# Patient Record
Sex: Female | Born: 1990 | Race: Black or African American | Hispanic: No | Marital: Single | Smoking: Never smoker
Health system: Southern US, Community
[De-identification: ages and names within clinical notes are randomized; demographics above are authoritative.]

---

## 2017-10-07 ENCOUNTER — Encounter (HOSPITAL_COMMUNITY): Payer: Self-pay | Admitting: *Deleted

## 2017-10-07 ENCOUNTER — Other Ambulatory Visit: Payer: Self-pay

## 2017-10-07 ENCOUNTER — Ambulatory Visit (HOSPITAL_COMMUNITY)
Admission: EM | Admit: 2017-10-07 | Discharge: 2017-10-07 | Disposition: A | Discharge status: Home / Self Care | Payer: Self-pay | Attending: Family Medicine | Admitting: Family Medicine

## 2017-10-07 DIAGNOSIS — R110 Nausea: Secondary | ICD-10-CM

## 2017-10-07 DIAGNOSIS — M545 Low back pain: Secondary | ICD-10-CM

## 2017-10-07 DIAGNOSIS — R300 Dysuria: Secondary | ICD-10-CM

## 2017-10-07 LAB — POCT URINALYSIS DIP (DEVICE)
BILIRUBIN URINE: NEGATIVE
Glucose, UA: NEGATIVE mg/dL
Hgb urine dipstick: NEGATIVE
KETONES UR: NEGATIVE mg/dL
Nitrite: NEGATIVE
PH: 6 (ref 5.0–8.0)
PROTEIN: NEGATIVE mg/dL
SPECIFIC GRAVITY, URINE: 1.025 (ref 1.005–1.030)
Urobilinogen, UA: 1 mg/dL (ref 0.0–1.0)

## 2017-10-07 LAB — POCT PREGNANCY, URINE: PREG TEST UR: NEGATIVE

## 2017-10-07 MED ORDER — NITROFURANTOIN MONOHYD MACRO 100 MG PO CAPS: 100.0000 mg | ORAL_CAPSULE | Freq: Two times a day (BID) | ORAL | 0 refills | Status: AC

## 2017-10-07 NOTE — ED Provider Notes (Signed)
[facility]    CSN: [id number] Arrival date & time: 10/07/17  1636     History   Chief Complaint Chief Complaint  Patient presents with  . Dysuria    HPI Anne Mckenzie is a 27 y.o. female.   [name] presents with complaints of pain and frequency with urination which started approximately three days ago. States also with low back pain and mild nausea. Without vomiting or diarrhea. Denies abdominal pain, vaginal bleeding, discharge or itching. States has had similar symptoms in the past and was UTI. Without fevers. She is sexually active with 1 partner, uses condoms, is not on birth control. No known std exposure. LMP 09/21/16. Pain to low back 4/10, it comes and goes. Took ibuprofen which did not help. Has increased her water intake.    ROS per HPI.       History reviewed. No pertinent past medical history.  There are no active problems to display for this patient.   History reviewed. No pertinent surgical history.  OB History    No data available       Home Medications    Prior to Admission medications   Medication Sig Start Date End Date Taking? Authorizing Provider  nitrofurantoin, macrocrystal-monohydrate, (MACROBID) 100 MG capsule Take 1 capsule (100 mg total) by mouth 2 (two) times daily for 5 days. 10/07/17 10/12/17  Georgetta HaberBurky, Natalie B, NP    Family History History reviewed. No pertinent family history.  Social History Social History   Tobacco Use  . Smoking status: Never Smoker  Substance Use Topics  . Alcohol use: Yes    Comment: rarely  . Drug use: No     Allergies   Vancomycin   Review of Systems Review of Systems   Physical Exam Triage Vital Signs ED Triage Vitals  Enc Vitals Group     BP 10/07/17 1741 106/66     Pulse Rate 10/07/17 1741 65     Resp 10/07/17 1741 16     Temp 10/07/17 1741 98.1 F (36.7 C)     Temp Source 10/07/17 1741 Oral     SpO2 10/07/17 1741 100 %     Weight --      Height --      Head  Circumference --      Peak Flow --      Pain Score 10/07/17 1742 4     Pain Loc --      Pain Edu? --      Excl. in GC? --    No data found.  Updated Vital Signs BP 106/66   Pulse 65   Temp 98.1 F (36.7 C) (Oral)   Resp 16   LMP 09/21/2017 (Exact Date)   SpO2 100%   Visual Acuity Right Eye Distance:   Left Eye Distance:   Bilateral Distance:    Right Eye Near:   Left Eye Near:    Bilateral Near:     Physical Exam  Constitutional: She is oriented to person, place, and time. She appears well-developed and well-nourished. No distress.  Cardiovascular: Normal rate, regular rhythm and normal heart sounds.  Pulmonary/Chest: Effort normal and breath sounds normal.  Abdominal: Soft. There is no tenderness.  Genitourinary:  Genitourinary Comments: Without abdominal pain, denies vaginal symptoms; gu exam deferred at this time  Neurological: She is alert and oriented to person, place, and time.  Skin: Skin is warm and dry.     UC Treatments / Results  Labs (all labs ordered are listed,  but only abnormal results are displayed) Labs Reviewed  POCT URINALYSIS DIP (DEVICE) - Abnormal; Notable for the following components:      Result Value   Leukocytes, UA TRACE (*)    All other components within normal limits  URINE CULTURE  POCT PREGNANCY, URINE  URINE CYTOLOGY ANCILLARY ONLY    EKG  EKG Interpretation None       Radiology No results found.  Procedures Procedures (including critical care time)  Medications Ordered in UC Medications - No data to display   Initial Impression / Assessment and Plan / UC Course  I have reviewed the triage vital signs and the nursing notes.  Pertinent labs & imaging results that were available during my care of the patient were reviewed by me and considered in my medical decision making (see chart for details).     Only trace leuks to urine today. History consistent with UTI however. Opted to initiate macrobid pending urine  culture. Urine cytology also pending at this time. Will notify of any positive findings and if any changes to treatment are needed.  Patient verbalized understanding and agreeable to plan.  Return precautions provided.   Final Clinical Impressions(s) / UC Diagnoses   Final diagnoses:  Dysuria    ED Discharge Orders        Ordered    nitrofurantoin, macrocrystal-monohydrate, (MACROBID) 100 MG capsule  2 times daily     10/07/17 1807       Controlled Substance Prescriptions [place] Controlled Substance Registry consulted? Not Applicable   Georgetta Haber, NP 10/07/17 [phone]

## 2017-10-07 NOTE — ED Triage Notes (Signed)
C/O dysuria and polyuria with low back pain x 2-3 days without fever.

## 2017-10-07 NOTE — Discharge Instructions (Signed)
Drink plenty of water and empty bladder regularly. Complete course of antibiotics. I have sent your urine for culture and testing, we will call if any positive results and if any changes to treatment is needed. If develop increased pain, vomiting, passing out, fevers or otherwise not improving please return to be seen or go to Er.

## 2017-10-25 ENCOUNTER — Encounter (HOSPITAL_COMMUNITY): Payer: Self-pay | Admitting: Emergency Medicine

## 2017-10-25 ENCOUNTER — Other Ambulatory Visit: Payer: Self-pay

## 2017-10-25 ENCOUNTER — Ambulatory Visit (HOSPITAL_COMMUNITY)
Admission: EM | Admit: 2017-10-25 | Discharge: 2017-10-25 | Disposition: A | Discharge status: Home / Self Care | Payer: BC Managed Care – PPO | Attending: Family Medicine | Admitting: Family Medicine

## 2017-10-25 DIAGNOSIS — R69 Illness, unspecified: Secondary | ICD-10-CM | POA: Diagnosis not present

## 2017-10-25 DIAGNOSIS — J111 Influenza due to unidentified influenza virus with other respiratory manifestations: Secondary | ICD-10-CM

## 2017-10-25 MED ORDER — HYDROCODONE-HOMATROPINE 5-1.5 MG/5ML PO SYRP: 5.0000 mL | ORAL_SOLUTION | Freq: Four times a day (QID) | ORAL | 0 refills | Status: DC | PRN

## 2017-10-25 NOTE — ED Provider Notes (Signed)
  [facility]   [id number] 10/25/17 Arrival Time: 1004  ASSESSMENT & PLAN:  1. Influenza-like illness     Meds ordered this encounter  Medications  . HYDROcodone-homatropine (HYCODAN) 5-1.5 MG/5ML syrup    Sig: Take 5 mLs by mouth every 6 (six) hours as needed for cough.    Dispense:  90 mL    Refill:  0   Cough medication sedation precautions. Discussed typical duration of symptoms. OTC symptom care as needed. Ensure adequate fluid intake and rest. May f/u with PCP or here as needed.  Reviewed expectations re: course of current medical issues. Questions answered. Outlined signs and symptoms indicating need for more acute intervention. Patient verbalized understanding. After Visit Summary given.   SUBJECTIVE: History from: patient.  Anne SanteeRomona Mckenzie is a 27 y.o. female who presents with complaint of nasal congestion, post-nasal drainage, and a persistent dry cough. Onset abrupt, approximately 3 days ago. Overall fatigued with body aches. SOB: none. Wheezing: none. Fever: yes, subjective. Overall normal PO intake without n/v. Sick contacts: no. OTC treatment: Mucinex without much relief.  Received flu shot this year: no.  Social History   Tobacco Use  Smoking Status Never Smoker  Smokeless Tobacco Never Used    ROS: As per HPI.   OBJECTIVE:  Vitals:   10/25/17 1038  BP: (!) 83/67  Pulse: 96  Resp: 18  Temp: 99.1 F (37.3 C)  TempSrc: Oral  SpO2: 100%     General appearance: alert; appears fatigued HEENT: nasal congestion; clear runny nose; throat irritation secondary to post-nasal drainage Neck: supple without LAD Lungs: unlabored respirations, symmetrical air entry; cough: moderate; no respiratory distress Skin: warm and dry Psychological: alert and cooperative; normal mood and affect   Allergies  Allergen Reactions  . Vancomycin     swelling    History reviewed. No pertinent past medical history. History reviewed. No pertinent family  history. Social History   Socioeconomic History  . Marital status: Single    Spouse name: Not on file  . Number of children: Not on file  . Years of education: Not on file  . Highest education level: Not on file  Social Needs  . Financial resource strain: Not on file  . Food insecurity - worry: Not on file  . Food insecurity - inability: Not on file  . Transportation needs - medical: Not on file  . Transportation needs - non-medical: Not on file  Occupational History  . Not on file  Tobacco Use  . Smoking status: Never Smoker  . Smokeless tobacco: Never Used  Substance and Sexual Activity  . Alcohol use: Yes    Comment: rarely  . Drug use: No  . Sexual activity: Yes    Birth control/protection: Condom  Other Topics Concern  . Not on file  Social History Narrative  . Not on file           Mardella LaymanHagler, Sayge Salvato, MD 10/25/17 1052

## 2017-10-25 NOTE — Discharge Instructions (Signed)

## 2017-10-25 NOTE — ED Triage Notes (Addendum)
Pt reports nasal congestion and cough x3 days.  She measured her fever last night at 101.  She reports vomiting x2 last night and this morning.  She reports taking Mucinex, Halls and Ibuprofen.

## 2018-02-10 ENCOUNTER — Encounter (HOSPITAL_COMMUNITY): Payer: Self-pay | Admitting: Emergency Medicine

## 2018-02-10 ENCOUNTER — Other Ambulatory Visit: Payer: Self-pay

## 2018-02-10 ENCOUNTER — Ambulatory Visit (HOSPITAL_COMMUNITY)
Admission: EM | Admit: 2018-02-10 | Discharge: 2018-02-10 | Disposition: A | Discharge status: Home / Self Care | Payer: BC Managed Care – PPO | Attending: Internal Medicine | Admitting: Internal Medicine

## 2018-02-10 DIAGNOSIS — N390 Urinary tract infection, site not specified: Secondary | ICD-10-CM | POA: Diagnosis not present

## 2018-02-10 LAB — POCT URINALYSIS DIP (DEVICE)
Bilirubin Urine: NEGATIVE
GLUCOSE, UA: NEGATIVE mg/dL
Ketones, ur: NEGATIVE mg/dL
Leukocytes, UA: NEGATIVE
NITRITE: NEGATIVE
PROTEIN: NEGATIVE mg/dL
Specific Gravity, Urine: 1.02 (ref 1.005–1.030)
UROBILINOGEN UA: 1 mg/dL (ref 0.0–1.0)
pH: 5.5 (ref 5.0–8.0)

## 2018-02-10 MED ORDER — SULFAMETHOXAZOLE-TRIMETHOPRIM 800-160 MG PO TABS: 1.0000 | ORAL_TABLET | Freq: Two times a day (BID) | ORAL | 0 refills | Status: DC

## 2018-02-10 MED ORDER — CIPROFLOXACIN HCL 500 MG PO TABS: 500.0000 mg | ORAL_TABLET | Freq: Two times a day (BID) | ORAL | 0 refills | Status: DC

## 2018-02-10 MED ORDER — ONDANSETRON HCL 4 MG PO TABS: 4.0000 mg | ORAL_TABLET | Freq: Four times a day (QID) | ORAL | 0 refills | Status: DC

## 2018-02-10 MED ORDER — PHENAZOPYRIDINE HCL 100 MG PO TABS: 100.0000 mg | ORAL_TABLET | Freq: Three times a day (TID) | ORAL | 0 refills | Status: DC | PRN

## 2018-02-10 NOTE — ED Triage Notes (Signed)
Frequent urination noticed yesterday, irritation

## 2018-02-10 NOTE — ED Provider Notes (Addendum)
[facility]    CSN: [id number] Arrival date & time: 02/10/18  1301     History   Chief Complaint Chief Complaint  Patient presents with  . Urinary Tract Infection    HPI Anne Mckenzie is a 27 y.o. female.   27 y.o. female presents with increase in urination  X  Days with "irritation" that occurs after urination . Condition is acute in nature. Condition is made better by nothing. Condition is made worse by nothing. Patient denies any treatment prior to there arrival at this facility. Patient denies and foul odor, flank pain, fevers, nausea, vomiting.       History reviewed. No pertinent past medical history.  There are no active problems to display for this patient.   History reviewed. No pertinent surgical history.  OB History   None      Home Medications    Prior to Admission medications   Medication Sig Start Date End Date Taking? Authorizing Provider  HYDROcodone-homatropine (HYCODAN) 5-1.5 MG/5ML syrup Take 5 mLs by mouth every 6 (six) hours as needed for cough. 10/25/17   Mardella Layman, MD  ibuprofen (ADVIL,MOTRIN) 400 MG tablet Take 400 mg by mouth every 6 (six) hours as needed.    [provider]    Family History History reviewed. No pertinent family history.  Social History Social History   Tobacco Use  . Smoking status: Never Smoker  . Smokeless tobacco: Never Used  Substance Use Topics  . Alcohol use: Yes    Comment: rarely  . Drug use: No     Allergies   Vancomycin   Review of Systems Review of Systems  Constitutional: Negative for chills and fever.  HENT: Negative for ear pain and sore throat.   Eyes: Negative for pain and visual disturbance.  Respiratory: Negative for cough and shortness of breath.   Cardiovascular: Negative for chest pain and palpitations.  Gastrointestinal: Negative for abdominal pain, diarrhea, nausea and vomiting.  Genitourinary: Positive for frequency and vaginal discharge. Negative for  dysuria, flank pain and hematuria.  Musculoskeletal: Negative for arthralgias and back pain.  Skin: Negative for color change and rash.  Neurological: Negative for seizures and syncope.  All other systems reviewed and are negative.    Physical Exam Triage Vital Signs ED Triage Vitals  Enc Vitals Group     BP 02/10/18 1402 109/72     Pulse Rate 02/10/18 1402 71     Resp 02/10/18 1402 18     Temp 02/10/18 1402 98.3 F (36.8 C)     Temp Source 02/10/18 1402 Oral     SpO2 02/10/18 1402 100 %     Weight --      Height --      Head Circumference --      Peak Flow --      Pain Score 02/10/18 1401 0     Pain Loc --      Pain Edu? --      Excl. in GC? --    No data found.  Updated Vital Signs BP 109/72 (BP Location: Left Arm)   Pulse 71   Temp 98.3 F (36.8 C) (Oral)   Resp 18   LMP 01/22/2018   SpO2 100%   Visual Acuity Right Eye Distance:   Left Eye Distance:   Bilateral Distance:    Right Eye Near:   Left Eye Near:    Bilateral Near:     Physical Exam  Constitutional: She is oriented to person,  place, and time. She appears well-developed and well-nourished.  HENT:  Head: Normocephalic and atraumatic.  Eyes: Conjunctivae are normal.  Neck: Normal range of motion.  Pulmonary/Chest: Effort normal.  Abdominal: Soft. There is tenderness ( umbilcal with palpation).  Hyper active bowel sounds  Neurological: She is alert and oriented to person, place, and time.  Psychiatric: She has a normal mood and affect.  Nursing note and vitals reviewed.    UC Treatments / Results  Labs (all labs ordered are listed, but only abnormal results are displayed) Labs Reviewed  POCT URINALYSIS DIP (DEVICE) - Abnormal; Notable for the following components:      Result Value   Hgb urine dipstick TRACE (*)    All other components within normal limits    EKG None  Radiology No results found.  Procedures Procedures (including critical care time)  Medications Ordered in  UC Medications - No data to display  Initial Impression / Assessment and Plan / UC Course  I have reviewed the triage vital signs and the nursing notes.  Pertinent labs & imaging results that were available during my care of the patient were reviewed by me and considered in my medical decision making (see chart for details).      Final Clinical Impressions(s) / UC Diagnoses   Final diagnoses:  None   Discharge Instructions   None    ED Prescriptions    None     Controlled Substance Prescriptions [place] Controlled Substance Registry consulted? Not Applicable   Alene MiresOmohundro, Sire Poet C, NP 02/10/18 1446    Alene Miresmohundro, Nysha Koplin C, NP 02/10/18 1447

## 2018-02-10 NOTE — Discharge Instructions (Addendum)
Continue to push fluids

## 2018-03-19 ENCOUNTER — Ambulatory Visit (HOSPITAL_COMMUNITY)
Admission: EM | Admit: 2018-03-19 | Discharge: 2018-03-19 | Disposition: A | Discharge status: Home / Self Care | Payer: BC Managed Care – PPO | Attending: Family Medicine | Admitting: Family Medicine

## 2018-03-19 ENCOUNTER — Encounter (HOSPITAL_COMMUNITY): Payer: Self-pay | Admitting: *Deleted

## 2018-03-19 ENCOUNTER — Other Ambulatory Visit: Payer: Self-pay

## 2018-03-19 DIAGNOSIS — N898 Other specified noninflammatory disorders of vagina: Secondary | ICD-10-CM | POA: Diagnosis present

## 2018-03-19 DIAGNOSIS — Z881 Allergy status to other antibiotic agents status: Secondary | ICD-10-CM | POA: Insufficient documentation

## 2018-03-19 LAB — POCT URINALYSIS DIP (DEVICE)
Bilirubin Urine: NEGATIVE
Glucose, UA: NEGATIVE mg/dL
KETONES UR: NEGATIVE mg/dL
Nitrite: NEGATIVE
PH: 5.5 (ref 5.0–8.0)
PROTEIN: NEGATIVE mg/dL
SPECIFIC GRAVITY, URINE: 1.025 (ref 1.005–1.030)
Urobilinogen, UA: 0.2 mg/dL (ref 0.0–1.0)

## 2018-03-19 LAB — POCT PREGNANCY, URINE: Preg Test, Ur: NEGATIVE

## 2018-03-19 MED ORDER — METRONIDAZOLE 500 MG PO TABS: 500.0000 mg | ORAL_TABLET | Freq: Two times a day (BID) | ORAL | 0 refills | Status: AC

## 2018-03-19 MED ORDER — FLUCONAZOLE 150 MG PO TABS: 150.0000 mg | ORAL_TABLET | Freq: Once | ORAL | 0 refills | Status: AC

## 2018-03-19 NOTE — ED Provider Notes (Signed)
[facility]    CSN: [id number] Arrival date & time: 03/19/18  0827     History   Chief Complaint Chief Complaint  Patient presents with  . Vaginal Itching    HPI Anne Mckenzie is a 27 y.o. female no significant past medical history presenting today for evaluation of vaginal discharge and irritation.  Patient states that for the past couple days she has had increased discharge that is "creamy".  She is also had itching and irritation in relation to urination, but denies dysuria, increased frequency.  LMP was 6/6, patient is not on any form of birth control.  She denies any abnormal bleeding or any pelvic pain.  Patient is sexually active, but declines concern for STDs.  Denies fever, nausea, vomiting, abdominal pain, back pain.  Patient states she has a history of both BV and yeast, states that this does not feel like either of these today.  HPI  History reviewed. No pertinent past medical history.  There are no active problems to display for this patient.   Past Surgical History:  Procedure Laterality Date  . CESAREAN SECTION      OB History   None      Home Medications    Prior to Admission medications   Medication Sig Start Date End Date Taking? Authorizing Provider  fluconazole (DIFLUCAN) 150 MG tablet Take 1 tablet (150 mg total) by mouth once for 1 dose. 03/19/18 03/19/18  Jaiden Wahab C, PA-C  ibuprofen (ADVIL,MOTRIN) 400 MG tablet Take 400 mg by mouth every 6 (six) hours as needed.    [provider]  metroNIDAZOLE (FLAGYL) 500 MG tablet Take 1 tablet (500 mg total) by mouth 2 (two) times daily for 7 days. 03/19/18 03/26/18  Cameka Rae, Junius CreamerHallie C, PA-C    Family History History reviewed. No pertinent family history.  Social History Social History   Tobacco Use  . Smoking status: Never Smoker  . Smokeless tobacco: Never Used  Substance Use Topics  . Alcohol use: Not Currently    Comment: rarely  . Drug use: No     Allergies    Vancomycin   Review of Systems Review of Systems  Constitutional: Negative for fever.  Respiratory: Negative for shortness of breath.   Cardiovascular: Negative for chest pain.  Gastrointestinal: Negative for abdominal pain, diarrhea, nausea and vomiting.  Genitourinary: Positive for vaginal discharge. Negative for dysuria, flank pain, frequency, genital sores, hematuria, menstrual problem, vaginal bleeding and vaginal pain.  Musculoskeletal: Negative for back pain.  Skin: Negative for rash.  Neurological: Negative for dizziness, light-headedness and headaches.     Physical Exam Triage Vital Signs ED Triage Vitals [03/19/18 0835]  Enc Vitals Group     BP 115/73     Pulse Rate 73     Resp 16     Temp 98.2 F (36.8 C)     Temp Source Oral     SpO2 98 %     Weight 190 lb (86.2 kg)     Height      Head Circumference      Peak Flow      Pain Score 0     Pain Loc      Pain Edu?      Excl. in GC?    No data found.  Updated Vital Signs BP 115/73 (BP Location: Left Arm)   Pulse 73   Temp 98.2 F (36.8 C) (Oral)   Resp 16   Wt 190 lb (86.2 kg)  LMP 02/15/2018   SpO2 98%   Visual Acuity Right Eye Distance:   Left Eye Distance:   Bilateral Distance:    Right Eye Near:   Left Eye Near:    Bilateral Near:     Physical Exam  Constitutional: She is oriented to person, place, and time. She appears well-developed and well-nourished.  No acute distress  HENT:  Head: Normocephalic and atraumatic.  Nose: Nose normal.  Eyes: Conjunctivae are normal.  Neck: Neck supple.  Cardiovascular: Normal rate.  Pulmonary/Chest: Effort normal. No respiratory distress.  Abdominal: She exhibits no distension.  Nontender light deep palpation throughout all 4 quadrants of epigastric  Musculoskeletal: Normal range of motion.  Neurological: She is alert and oriented to person, place, and time.  Skin: Skin is warm and dry.  Psychiatric: She has a normal mood and affect.  Nursing  note and vitals reviewed.    UC Treatments / Results  Labs (all labs ordered are listed, but only abnormal results are displayed) Labs Reviewed  CERVICOVAGINAL ANCILLARY ONLY    EKG None  Radiology No results found.  Procedures Procedures (including critical care time)  Medications Ordered in UC Medications - No data to display  Initial Impression / Assessment and Plan / UC Course  I have reviewed the triage vital signs and the nursing notes.  Pertinent labs & imaging results that were available during my care of the patient were reviewed by me and considered in my medical decision making (see chart for details).     Pregnancy test negative, UA with small leuks, will send for culture.  Vaginal swab obtained.  We will go ahead and empirically treat for yeast and BV with Flagyl and Diflucan.  Will call patient with results and alter treatment as needed.  Vital signs stable, exam unremarkable, patient without abdominal pain or pelvic pain.Discussed strict return precautions. Patient verbalized understanding and is agreeable with plan.  Final Clinical Impressions(s) / UC Diagnoses   Final diagnoses:  Vaginal discharge   Discharge Instructions   None    ED Prescriptions    Medication Sig Dispense Auth. Provider   metroNIDAZOLE (FLAGYL) 500 MG tablet Take 1 tablet (500 mg total) by mouth 2 (two) times daily for 7 days. 14 tablet Kable Haywood C, PA-C   fluconazole (DIFLUCAN) 150 MG tablet Take 1 tablet (150 mg total) by mouth once for 1 dose. 2 tablet Chee Kinslow C, PA-C     Controlled Substance Prescriptions [place] Controlled Substance Registry consulted? Not Applicable   Lew Dawes, [place] 03/19/18 0900

## 2018-03-19 NOTE — Discharge Instructions (Signed)
I am going to go ahead and treat you for yeast as well as bacterial vaginosis today.  Please take 1 tablet of Diflucan today, begin metronidazole twice daily for the next week.  May repeat Diflucan at the end of metronidazole if still having irritation and symptoms.  Please do not drink alcohol while taking the metronidazole.  We are testing you for Gonorrhea, Chlamydia, Trichomonas, Yeast and Bacterial Vaginosis. We will call you if anything is positive and let you know if you require any further treatment. Please inform partners of any positive results.   Please return if symptoms not improving with treatment, development of fever, nausea, vomiting, abdominal pain.

## 2018-03-19 NOTE — ED Triage Notes (Signed)
C/o vaginal discharge onset several days ago.

## 2018-03-20 ENCOUNTER — Telehealth (HOSPITAL_COMMUNITY): Payer: Self-pay

## 2018-03-20 LAB — CERVICOVAGINAL ANCILLARY ONLY
Bacterial vaginitis: POSITIVE — AB
Candida vaginitis: NEGATIVE
Chlamydia: NEGATIVE
NEISSERIA GONORRHEA: NEGATIVE
TRICH (WINDOWPATH): NEGATIVE

## 2018-03-20 LAB — URINE CULTURE: CULTURE: NO GROWTH

## 2018-03-20 NOTE — Telephone Encounter (Signed)
Bacterial Vaginosis test is positive.  Prescription for metronidazole was given at the urgent care visit. Attempted to reach patient. No answer at this time. 

## 2018-04-20 ENCOUNTER — Encounter (HOSPITAL_COMMUNITY): Payer: Self-pay

## 2018-04-20 ENCOUNTER — Other Ambulatory Visit: Payer: Self-pay

## 2018-04-20 ENCOUNTER — Ambulatory Visit (HOSPITAL_COMMUNITY)
Admission: EM | Admit: 2018-04-20 | Discharge: 2018-04-20 | Disposition: A | Discharge status: Home / Self Care | Payer: BC Managed Care – PPO | Attending: Family Medicine | Admitting: Family Medicine

## 2018-04-20 DIAGNOSIS — Z881 Allergy status to other antibiotic agents status: Secondary | ICD-10-CM | POA: Diagnosis not present

## 2018-04-20 DIAGNOSIS — Z79899 Other long term (current) drug therapy: Secondary | ICD-10-CM | POA: Diagnosis not present

## 2018-04-20 DIAGNOSIS — J029 Acute pharyngitis, unspecified: Secondary | ICD-10-CM | POA: Diagnosis not present

## 2018-04-20 DIAGNOSIS — R1310 Dysphagia, unspecified: Secondary | ICD-10-CM | POA: Diagnosis not present

## 2018-04-20 DIAGNOSIS — R0789 Other chest pain: Secondary | ICD-10-CM | POA: Insufficient documentation

## 2018-04-20 DIAGNOSIS — R0981 Nasal congestion: Secondary | ICD-10-CM | POA: Diagnosis not present

## 2018-04-20 DIAGNOSIS — Z9889 Other specified postprocedural states: Secondary | ICD-10-CM | POA: Diagnosis not present

## 2018-04-20 DIAGNOSIS — R05 Cough: Secondary | ICD-10-CM

## 2018-04-20 LAB — POCT RAPID STREP A: Streptococcus, Group A Screen (Direct): NEGATIVE

## 2018-04-20 MED ORDER — CETIRIZINE HCL 10 MG PO CAPS: 10.0000 mg | ORAL_CAPSULE | Freq: Every day | ORAL | 0 refills | Status: AC

## 2018-04-20 MED ORDER — PSEUDOEPH-BROMPHEN-DM 30-2-10 MG/5ML PO SYRP: 5.0000 mL | ORAL_SOLUTION | Freq: Four times a day (QID) | ORAL | 0 refills | Status: AC | PRN

## 2018-04-20 NOTE — Discharge Instructions (Signed)

## 2018-04-20 NOTE — ED Triage Notes (Signed)
Sore throat, cough makes chest discomfort 1 day.

## 2018-04-21 NOTE — ED Provider Notes (Signed)
[facility]    CSN: [id number] Arrival date & time: 04/20/18  1710     History   Chief Complaint Chief Complaint  Patient presents with  . Sore Throat    HPI Anne Mckenzie is a 27 y.o. female no contributing past history; Patient is presenting with URI symptoms- congestion, cough, sore throat.  Cough has been productive with mucus.  Noting mild chest tightness.  Patient's main complaints are sore throat.  She woke up this morning with difficulty swallowing.  Symptoms have been going on for 1 day. Patient has tried ibuprofen and Tylenol, with minimal relief. Denies fever, nausea, vomiting, diarrhea. Denies shortness of breath.  Denies history of asthma, does endorse smoking.  HPI  History reviewed. No pertinent past medical history.  There are no active problems to display for this patient.   Past Surgical History:  Procedure Laterality Date  . CESAREAN SECTION      OB History   None      Home Medications    Prior to Admission medications   Medication Sig Start Date End Date Taking? Authorizing Provider  brompheniramine-pseudoephedrine-DM 30-2-10 MG/5ML syrup Take 5 mLs by mouth 4 (four) times daily as needed. 04/20/18   Brittin Belnap C, PA-C  Cetirizine HCl 10 MG CAPS Take 1 capsule (10 mg total) by mouth daily for 10 days. 04/20/18 04/30/18  Salbador Fiveash, Junius CreamerHallie C, PA-C    Family History History reviewed. No pertinent family history.  Social History Social History   Tobacco Use  . Smoking status: Never Smoker  . Smokeless tobacco: Never Used  Substance Use Topics  . Alcohol use: Not Currently    Comment: rarely  . Drug use: No     Allergies   Vancomycin   Review of Systems Review of Systems  Constitutional: Negative for activity change, appetite change, chills, fatigue and fever.  HENT: Positive for congestion and sore throat. Negative for ear pain, rhinorrhea, sinus pressure and trouble swallowing.   Eyes: Negative for discharge and redness.    Respiratory: Positive for cough and chest tightness. Negative for shortness of breath.   Cardiovascular: Negative for chest pain.  Gastrointestinal: Negative for abdominal pain, diarrhea, nausea and vomiting.  Musculoskeletal: Negative for myalgias.  Skin: Negative for rash.  Neurological: Negative for dizziness, light-headedness and headaches.     Physical Exam Triage Vital Signs ED Triage Vitals  Enc Vitals Group     BP 04/20/18 1808 101/66     Pulse --      Resp 04/20/18 1808 16     Temp 04/20/18 1808 97.8 F (36.6 C)     Temp Source 04/20/18 1808 Oral     SpO2 04/20/18 1808 100 %     Weight 04/20/18 1806 190 lb (86.2 kg)     Height --      Head Circumference --      Peak Flow --      Pain Score 04/20/18 1806 5     Pain Loc --      Pain Edu? --      Excl. in GC? --    No data found.  Updated Vital Signs BP 101/66   Temp 97.8 F (36.6 C) (Oral)   Resp 16   Wt 190 lb (86.2 kg)   LMP 03/17/2018   SpO2 100%   Visual Acuity Right Eye Distance:   Left Eye Distance:   Bilateral Distance:    Right Eye Near:   Left Eye Near:    Bilateral  Near:     Physical Exam  Constitutional: She appears well-developed and well-nourished. No distress.  HENT:  Head: Normocephalic and atraumatic.  Bilateral ears without tenderness to palpation of external auricle, tragus and mastoid, EAC's without erythema or swelling, TM's with good bony landmarks and cone of light. Non erythematous.  Oral mucosa pink and moist, no tonsillar enlargement or exudate. Posterior pharynx patent and erythematous, no uvula deviation or swelling. Sounds congested  Eyes: Conjunctivae are normal.  Neck: Neck supple.  Cardiovascular: Normal rate and regular rhythm.  No murmur heard. Pulmonary/Chest: Effort normal and breath sounds normal. No respiratory distress.  Breathing comfortably at rest, CTABL, no wheezing, rales or other adventitious sounds auscultated; occasional dry cough in room  Abdominal:  Soft. There is no tenderness.  Musculoskeletal: She exhibits no edema.  Neurological: She is alert.  Skin: Skin is warm and dry.  Psychiatric: She has a normal mood and affect.  Nursing note and vitals reviewed.    UC Treatments / Results  Labs (all labs ordered are listed, but only abnormal results are displayed) Labs Reviewed  CULTURE, GROUP A STREP [facility])  POCT RAPID STREP A    EKG None  Radiology No results found.  Procedures Procedures (including critical care time)  Medications Ordered in UC Medications - No data to display  Initial Impression / Assessment and Plan / UC Course  I have reviewed the triage vital signs and the nursing notes.  Pertinent labs & imaging results that were available during my care of the patient were reviewed by me and considered in my medical decision making (see chart for details).     Vital signs stable, URI symptoms for 1 day, strep test negative.  Likely viral etiology.  Will recommend symptomatic management.  Recommendations below, provided daily allergy pill and cough syrup with decongestant.Discussed strict return precautions. Patient verbalized understanding and is agreeable with plan.  Final Clinical Impressions(s) / UC Diagnoses   Final diagnoses:  Sore throat     Discharge Instructions     Sore Throat  Your rapid strep tested Negative today. We will send for a culture and call in about 2 days if results are positive. For now we will treat your sore throat as a virus with symptom management.   Please continue Tylenol or Ibuprofen for fever and pain. May try salt water gargles, cepacol lozenges, throat spray, or OTC cold relief medicine for throat discomfort. If you also have congestion take a daily anti-histamine like Zyrtec, Claritin, and a oral decongestant to help with post nasal drip that may be irritating your throat.   Stay hydrated and drink plenty of fluids to keep your throat coated relieve irritation.    ED  Prescriptions    Medication Sig Dispense Auth. Provider   Cetirizine HCl 10 MG CAPS Take 1 capsule (10 mg total) by mouth daily for 10 days. 10 capsule Freman Lapage C, PA-C   brompheniramine-pseudoephedrine-DM 30-2-10 MG/5ML syrup Take 5 mLs by mouth 4 (four) times daily as needed. 120 mL Twisha Vanpelt C, PA-C     Controlled Substance Prescriptions [place] Controlled Substance Registry consulted? Not Applicable   Lew Dawes, [place] 04/21/18 [phone]

## 2018-04-22 ENCOUNTER — Emergency Department (HOSPITAL_COMMUNITY): Payer: BC Managed Care – PPO

## 2018-04-22 ENCOUNTER — Other Ambulatory Visit: Payer: Self-pay

## 2018-04-22 ENCOUNTER — Emergency Department (HOSPITAL_COMMUNITY)
Admission: EM | Admit: 2018-04-22 | Discharge: 2018-04-23 | Disposition: A | Discharge status: Home / Self Care | Payer: BC Managed Care – PPO | Attending: Emergency Medicine | Admitting: Emergency Medicine

## 2018-04-22 ENCOUNTER — Encounter (HOSPITAL_COMMUNITY): Payer: Self-pay | Admitting: *Deleted

## 2018-04-22 DIAGNOSIS — N888 Other specified noninflammatory disorders of cervix uteri: Secondary | ICD-10-CM

## 2018-04-22 DIAGNOSIS — R110 Nausea: Secondary | ICD-10-CM | POA: Diagnosis not present

## 2018-04-22 DIAGNOSIS — R109 Unspecified abdominal pain: Secondary | ICD-10-CM | POA: Diagnosis present

## 2018-04-22 DIAGNOSIS — R197 Diarrhea, unspecified: Secondary | ICD-10-CM | POA: Diagnosis not present

## 2018-04-22 DIAGNOSIS — B9689 Other specified bacterial agents as the cause of diseases classified elsewhere: Secondary | ICD-10-CM | POA: Diagnosis not present

## 2018-04-22 DIAGNOSIS — N760 Acute vaginitis: Secondary | ICD-10-CM | POA: Diagnosis not present

## 2018-04-22 DIAGNOSIS — R1031 Right lower quadrant pain: Secondary | ICD-10-CM | POA: Insufficient documentation

## 2018-04-22 LAB — URINALYSIS, ROUTINE W REFLEX MICROSCOPIC
BILIRUBIN URINE: NEGATIVE
Glucose, UA: NEGATIVE mg/dL
Ketones, ur: NEGATIVE mg/dL
Leukocytes, UA: NEGATIVE
NITRITE: NEGATIVE
PH: 6 (ref 5.0–8.0)
Protein, ur: NEGATIVE mg/dL
SPECIFIC GRAVITY, URINE: 1.014 (ref 1.005–1.030)

## 2018-04-22 LAB — COMPREHENSIVE METABOLIC PANEL
ALT: 12 U/L (ref 0–44)
AST: 21 U/L (ref 15–41)
Albumin: 4.2 g/dL (ref 3.5–5.0)
Alkaline Phosphatase: 60 U/L (ref 38–126)
Anion gap: 11 (ref 5–15)
BILIRUBIN TOTAL: 0.6 mg/dL (ref 0.3–1.2)
BUN: 10 mg/dL (ref 6–20)
CO2: 24 mmol/L (ref 22–32)
CREATININE: 0.95 mg/dL (ref 0.44–1.00)
Calcium: 9.7 mg/dL (ref 8.9–10.3)
Chloride: 105 mmol/L (ref 98–111)
GFR calc Af Amer: >60 mL/min (ref >60)
Glucose, Bld: 84 mg/dL (ref 70–99)
Potassium: 3.8 mmol/L (ref 3.5–5.1)
Sodium: 140 mmol/L (ref 135–145)
TOTAL PROTEIN: 7.5 g/dL (ref 6.5–8.1)

## 2018-04-22 LAB — WET PREP, GENITAL
Sperm: NONE SEEN
TRICH WET PREP: NONE SEEN
Yeast Wet Prep HPF POC: NONE SEEN

## 2018-04-22 LAB — LIPASE, BLOOD: Lipase: 38 U/L (ref 11–51)

## 2018-04-22 LAB — I-STAT BETA HCG BLOOD, ED (MC, WL, AP ONLY): I-stat hCG, quantitative: <5 m[IU]/mL (ref <5)

## 2018-04-22 LAB — CBC
HCT: 41.5 % (ref 36.0–46.0)
Hemoglobin: 13.5 g/dL (ref 12.0–15.0)
MCH: 27.8 pg (ref 26.0–34.0)
MCHC: 32.5 g/dL (ref 30.0–36.0)
MCV: 85.6 fL (ref 78.0–100.0)
PLATELETS: 249 10*3/uL (ref 150–400)
RBC: 4.85 MIL/uL (ref 3.87–5.11)
RDW: 13.4 % (ref 11.5–15.5)
WBC: 6.8 10*3/uL (ref 4.0–10.5)

## 2018-04-22 MED ORDER — IOHEXOL 300 MG/ML  SOLN
100.0000 mL | Freq: Once | INTRAMUSCULAR | Status: AC | PRN
  Administered 2018-04-22: 100 mL via INTRAVENOUS

## 2018-04-22 MED ORDER — MORPHINE SULFATE (PF) 4 MG/ML IV SOLN
4.0000 mg | Freq: Once | INTRAVENOUS | Status: AC
  Administered 2018-04-22: 4 mg via INTRAVENOUS
  Filled 2018-04-22: qty 1

## 2018-04-22 MED ORDER — SODIUM CHLORIDE 0.9 % IV BOLUS
1000.0000 mL | Freq: Once | INTRAVENOUS | Status: AC
  Administered 2018-04-22: 1000 mL via INTRAVENOUS

## 2018-04-22 MED ORDER — ONDANSETRON HCL 4 MG/2ML IJ SOLN
4.0000 mg | Freq: Once | INTRAMUSCULAR | Status: AC
  Administered 2018-04-22: 4 mg via INTRAVENOUS
  Filled 2018-04-22: qty 2

## 2018-04-22 NOTE — ED Provider Notes (Signed)
[facility]   CSN: [id number] Arrival date & time: 04/22/18  1936     History   Chief Complaint Chief Complaint  Patient presents with  . Abdominal Pain    HPI Anne Mckenzie is a 27 y.o. female.  The history is provided by the patient. No language interpreter was used.  Abdominal Pain       27 year old female presenting for evaluation of abdominal pain.  Patient reports since yesterday morning she has had abdominal pain, nausea, and having some loose stools.  Abdominal pain is described as a sharp stabbing sensation, located in her right lower quadrant, persistent worsening with movement.  She has no appetite.  Pain is moderate in severity.  Pain is nonradiating.  She denies fever, chills, chest pain, shortness of breath, dysuria, vaginal bleeding, vaginal discharge or rash.  She went to urgent care today but was recommended to come to the hospital to rule out appendicitis.  Her last menstrual period was July 6.  History reviewed. No pertinent past medical history.  There are no active problems to display for this patient.   Past Surgical History:  Procedure Laterality Date  . CESAREAN SECTION       OB History   None      Home Medications    Prior to Admission medications   Medication Sig Start Date End Date Taking? Authorizing Provider  brompheniramine-pseudoephedrine-DM 30-2-10 MG/5ML syrup Take 5 mLs by mouth 4 (four) times daily as needed. 04/20/18   Wieters, Hallie C, PA-C  Cetirizine HCl 10 MG CAPS Take 1 capsule (10 mg total) by mouth daily for 10 days. 04/20/18 04/30/18  Wieters, Junius CreamerHallie C, PA-C    Family History No family history on file.  Social History Social History   Tobacco Use  . Smoking status: Never Smoker  . Smokeless tobacco: Never Used  Substance Use Topics  . Alcohol use: Not Currently    Comment: rarely  . Drug use: No     Allergies   Vancomycin   Review of Systems Review of  Systems  Gastrointestinal: Positive for abdominal pain.  All other systems reviewed and are negative.    Physical Exam Updated Vital Signs BP 109/76 (BP Location: Right Arm)   Pulse 77   Temp 98.4 F (36.9 C) (Oral)   Resp 16   Ht 5\' 7"  (1.702 m)   Wt 87.5 kg   LMP 03/17/2018   SpO2 100%   BMI 30.23 kg/m   Physical Exam  Constitutional: She appears well-developed and well-nourished. No distress.  HENT:  Head: Atraumatic.  Eyes: Conjunctivae are normal.  Neck: Neck supple.  Cardiovascular: Normal rate and regular rhythm.  Pulmonary/Chest: Effort normal and breath sounds normal.  Abdominal: Soft. Normal appearance. There is tenderness in the right lower quadrant and suprapubic area. There is tenderness at McBurney's point. There is negative Murphy's sign.  Genitourinary:  Genitourinary Comments: Chaperone present during exam.  No inguinal lymphadenopathy or inguinal hernia noted.  Normal external genitalia.  Mild functional discharge noted in vaginal vault.  Closed cervical os visualized.  On bimanual examination, no adnexal tenderness or cervical motion tenderness.  Neurological: She is alert.  Skin: No rash noted.  Psychiatric: She has a normal mood and affect.  Nursing note and vitals reviewed.    ED Treatments / Results  Labs (all labs ordered are listed, but only abnormal results are displayed) Labs Reviewed  WET PREP, GENITAL - Abnormal; Notable for the following components:  Result Value   Clue Cells Wet Prep HPF POC PRESENT (*)    WBC, Wet Prep HPF POC MANY (*)    All other components within normal limits  URINALYSIS, ROUTINE W REFLEX MICROSCOPIC - Abnormal; Notable for the following components:   APPearance CLOUDY (*)    Hgb urine dipstick SMALL (*)    Bacteria, UA RARE (*)    All other components within normal limits  LIPASE, BLOOD  COMPREHENSIVE METABOLIC PANEL  CBC  I-STAT BETA HCG BLOOD, ED ([facility], [facility], [facility] ONLY)  GC/CHLAMYDIA PROBE AMP ([place])  NOT AT [facility]    EKG None  Radiology Ct Abdomen Pelvis W Contrast  Result Date: 04/23/2018 CLINICAL DATA:  Right mid and low abdominal pain with nausea, vomiting, and diarrhea for 2 days. EXAM: CT ABDOMEN AND PELVIS WITH CONTRAST TECHNIQUE: Multidetector CT imaging of the abdomen and pelvis was performed using the standard protocol following bolus administration of intravenous contrast. CONTRAST:  OMNIPAQUE IOHEXOL 300 MG/ML  SOLN COMPARISON:  None. FINDINGS: Lower chest: Dependent changes in the lung bases. Hepatobiliary: No focal liver abnormality is seen. No gallstones, gallbladder wall thickening, or biliary dilatation. Pancreas: Unremarkable. No pancreatic ductal dilatation or surrounding inflammatory changes. Spleen: Normal in size without focal abnormality. Adrenals/Urinary Tract: Adrenal glands are unremarkable. Kidneys are normal, without renal calculi, focal lesion, or hydronephrosis. Bladder is unremarkable. Stomach/Bowel: Stomach, small bowel, and colon are not abnormally distended. Scattered stool throughout the colon. The appendix is normal. Vascular/Lymphatic: No significant vascular findings are present. No enlarged abdominal or pelvic lymph nodes. Reproductive: Uterus is anteverted and appears normal. Mild prominence of the ovaries bilaterally with no specific focal nodules. Probable involuting cyst on the right ovary. There is fullness in the adnexal regions possibly representing dilated tubules. Changes could reflect hydrosalpinx or pelvic inflammatory disease. Suggest ultrasound for further evaluation. Small amount of free fluid in the pelvis is likely physiologic or inflammatory. Other: No free air in the abdomen. Abdominal wall musculature appears intact. Musculoskeletal: No acute or significant osseous findings. IMPRESSION: 1. Nonspecific fullness in the adnexal regions possibly representing hydrosalpinx or pelvic inflammatory disease. Suggest ultrasound for further evaluation. 2.  No evidence of bowel obstruction or inflammation. Appendix is normal. Electronically Signed   By: Burman Nieves M.D.   On: 04/23/2018 00:12    Procedures Pelvic exam Date/Time: 04/22/2018 10:54 PM Performed by: Fayrene Helper, PA-C Authorized by: Fayrene Helper, PA-C  Patient understanding: patient states understanding of the procedure being performed Patient consent: the patient's understanding of the procedure matches consent given Patient identity confirmed: verbally with patient Patient tolerance: Patient tolerated the procedure well with no immediate complications    EMERGENCY DEPARTMENT  US GUIDANCE EXAM Emergency Ultrasound:  US Guidance for Needle Guidance  INDICATIONS: Difficult vascular access Linear probe used in real-time to visualize location of needle entry through skin.   PERFORMED BY: Myself IMAGES ARCHIVED?: No LIMITATIONS: Pain VIEWS USED: Transverse INTERPRETATION: Left arm and Needle gauge 20   (including critical care time)  Medications Ordered in ED Medications  morphine 4 MG/ML injection 4 mg (4 mg Intravenous Given 04/22/18 2303)  ondansetron (ZOFRAN) injection 4 mg (4 mg Intravenous Given 04/22/18 2303)  sodium chloride 0.9 % bolus 1,000 mL (0 mLs Intravenous Stopped 04/23/18 0005)  iohexol (OMNIPAQUE) 300 MG/ML solution 100 mL (100 mLs Intravenous Contrast Given 04/22/18 2346)  cefTRIAXone (ROCEPHIN) injection 250 mg (250 mg Intramuscular Given 04/23/18 0040)  azithromycin (ZITHROMAX) tablet 1,000 mg (1,000 mg Oral Given 04/23/18 0040)  lidocaine (PF) (XYLOCAINE) 1 % injection 5 mL (5 mLs Other Given 04/23/18 0040)     Initial Impression / Assessment and Plan / ED Course  I have reviewed the triage vital signs and the nursing notes.  Pertinent labs & imaging results that were available during my care of the patient were reviewed by me and considered in my medical decision making (see chart for details).     BP 95/65   Pulse (!) 58   Temp 98.4 F (36.9  C) (Oral)   Resp 16   Ht 5\' 7"  (1.702 m)   Wt 87.5 kg   LMP 03/17/2018   SpO2 99%   BMI 30.23 kg/m    Final Clinical Impressions(s) / ED Diagnoses   Final diagnoses:  None    ED Discharge Orders    None     9:35 PM Patient here with pain to the lower right lower quadrant with decreased appetite, nausea, having loose stools.  Her symptom is concerning for potential appendicitis.  Work-up initiated.  We will also perform pelvic examination.  10:55 PM Pelvic examination without evidence to suggest PID or cervicitis.  Patient is difficult IV vascular access.  I was able to insert a 20-gauge IV to left cephalic vein.  10:57 PM Pregnancy test is negative, labs are otherwise reassuring, urine without signs of urinary tract infection, normal lipase, normal WBC.  However given the location of her symptoms, will obtain abdominal pelvic CT scan to rule out acute abdominal pathology.  12:27 AM Wet prep shows evidence of clue cells and many WBC.  However patient does not have any significant adnexal tenderness or cervical motion tenderness on initial pelvic exam.  Abdominal pelvis CT scan obtained showed nonspecific fullness and adnexal regions possibly representing hydrosalpinx or pelvic inflammatory disease.  Suggest ultrasound for further evaluation.  No evidence of bowel obstruction or inflammation in the appendix is normal.  Given this finding, will obtain a transvaginal ultrasound for further evaluation.  Patient signed out to oncoming provider who will follow-up on pelvic ultrasound results and determine disposition.  Patient is made aware of plan.  Evidence of clue cells on wet prep.  However, does not need to be treated for BV as pt does not have strong vaginal odor or having vaginal discomfort.     Fayrene Helper, PA-C 04/23/18 0057    Sabas Sous, MD 04/23/18 [phone]

## 2018-04-22 NOTE — ED Notes (Signed)
Attempted to start IV without success.  Blew X 2.  To get a coworker to try.

## 2018-04-22 NOTE — ED Triage Notes (Signed)
abd paion for 2 days no n or v  No temp  She [place] [facility] was told to come to the hospital to r.o  Appendicitis  lmp July 6th

## 2018-04-23 ENCOUNTER — Emergency Department (HOSPITAL_COMMUNITY): Payer: BC Managed Care – PPO

## 2018-04-23 LAB — GC/CHLAMYDIA PROBE AMP (~~LOC~~) NOT AT ARMC
Chlamydia: NEGATIVE
Neisseria Gonorrhea: NEGATIVE

## 2018-04-23 LAB — CULTURE, GROUP A STREP (THRC)

## 2018-04-23 MED ORDER — IBUPROFEN 800 MG PO TABS: 800.0000 mg | ORAL_TABLET | Freq: Three times a day (TID) | ORAL | 0 refills | Status: AC

## 2018-04-23 MED ORDER — METRONIDAZOLE 500 MG PO TABS: 500.0000 mg | ORAL_TABLET | Freq: Two times a day (BID) | ORAL | 0 refills | Status: AC

## 2018-04-23 MED ORDER — LIDOCAINE HCL (PF) 1 % IJ SOLN
5.0000 mL | Freq: Once | INTRAMUSCULAR | Status: AC
  Administered 2018-04-23: 5 mL
  Filled 2018-04-23: qty 5

## 2018-04-23 MED ORDER — CEFTRIAXONE SODIUM 250 MG IJ SOLR
250.0000 mg | Freq: Once | INTRAMUSCULAR | Status: AC
  Administered 2018-04-23: 250 mg via INTRAMUSCULAR
  Filled 2018-04-23: qty 250

## 2018-04-23 MED ORDER — AZITHROMYCIN 250 MG PO TABS
1000.0000 mg | ORAL_TABLET | Freq: Once | ORAL | Status: AC
  Administered 2018-04-23: 1000 mg via ORAL
  Filled 2018-04-23: qty 4

## 2018-04-23 NOTE — Discharge Instructions (Addendum)
Please follow-up with an OB/GYN regarding the cyst that was found.  You may take anti-inflammatories.  There was also a bacterial infection that was found, please take the antibiotic for this.

## 2018-04-23 NOTE — ED Notes (Signed)
Taken to US at this time. 

## 2018-04-23 NOTE — ED Notes (Signed)
Pt in US

## 2018-04-23 NOTE — ED Provider Notes (Signed)
Patient signed out to me at shift change.  Patient originally presented with lower abdominal pain.  CT was performed, which shows normal appendix, but there was possibility for hydrosalpinx, therefore ultrasound was ordered by prior team.  Ultrasound shows 5 mm complex cyst which is likely consistent with nabothian cyst.  Will recommend OB/GYN follow-up and NSAIDs.  Patient does have clue cells on wet prep, will treat with Flagyl.  Patient understands and agrees the plan.  She is stable and ready for discharge.   Roxy HorsemanBrowning, Domingos Riggi, PA-C 04/23/18 [phone]    Zadie RhineWickline, Donald, MD 04/24/18 [phone]

## 2018-05-30 ENCOUNTER — Ambulatory Visit: Payer: BC Managed Care – PPO | Admitting: Obstetrics and Gynecology

## 2019-05-05 IMAGING — US US ART/VEN ABD/PELV/SCROTUM DOPPLER LTD
1 series · 13 of 25 positions shown · non-contrast
Comparison: 04/22/2018 CT abdomen and pelvis.

CLINICAL DATA: 27 y/o F; right mid to lower abdominal pain with
nausea, vomiting, and diarrhea for 2 days. Abnormal CT of chest.

EXAM:
TRANSABDOMINAL AND TRANSVAGINAL ULTRASOUND OF PELVIS
DOPPLER ULTRASOUND OF OVARIES
TECHNIQUE: Both transabdominal and transvaginal ultrasound examinations of the
pelvis were performed. Transabdominal technique was performed for
global imaging of the pelvis including uterus, ovaries, adnexal
regions, and pelvic cul-de-sac.
It was necessary to proceed with endovaginal exam following the
transabdominal exam to visualize the ovaries. Color and duplex
Doppler ultrasound was utilized to evaluate blood flow to the
ovaries.

[Series 1: us art/ven abd/pelv/scrotum doppler ltd · 0.24mm/px · 13 of 66 slices shown]
[im 1/66]
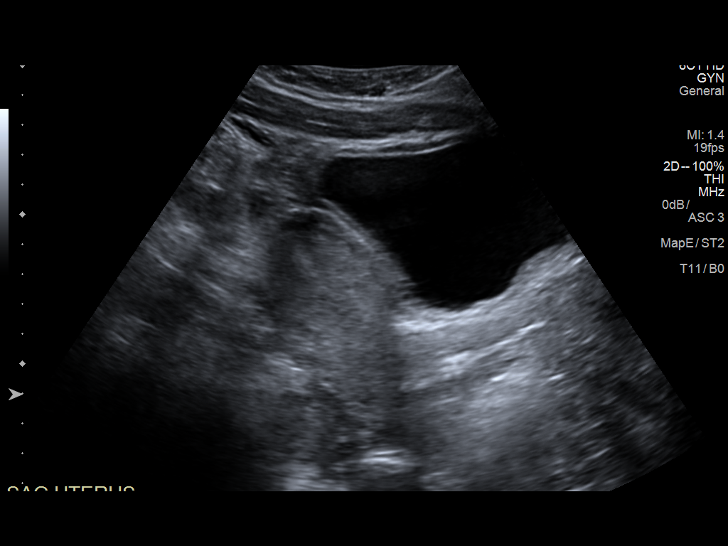
[im 6/66]
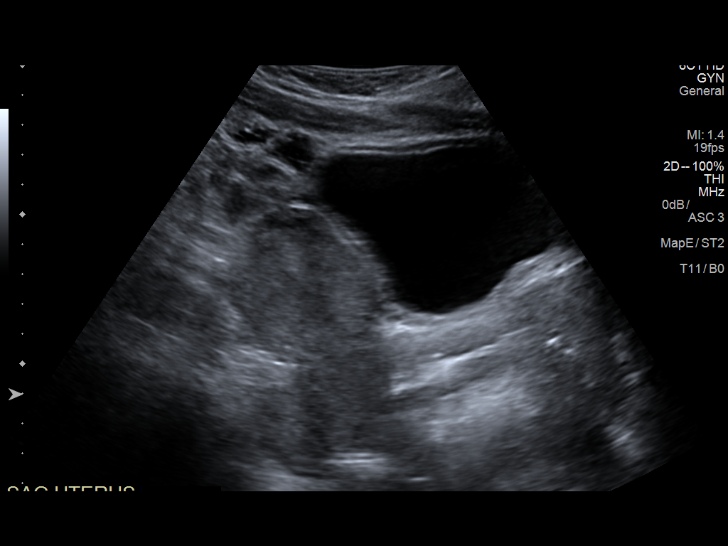
[im 11/66]
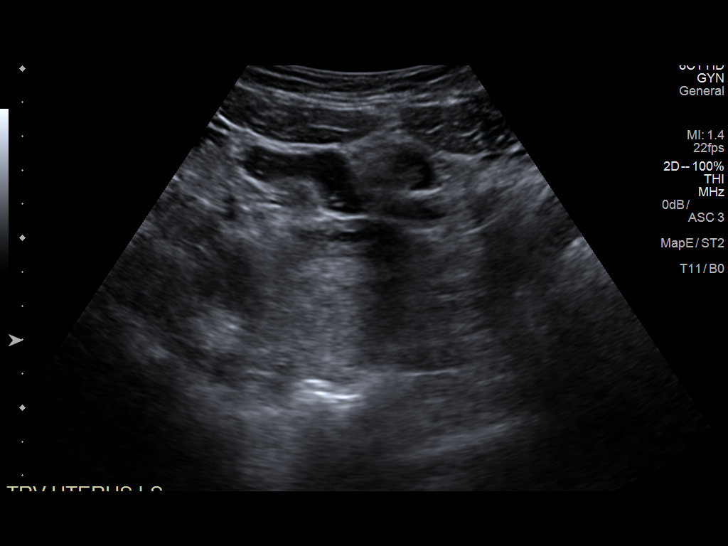
[im 17/66]
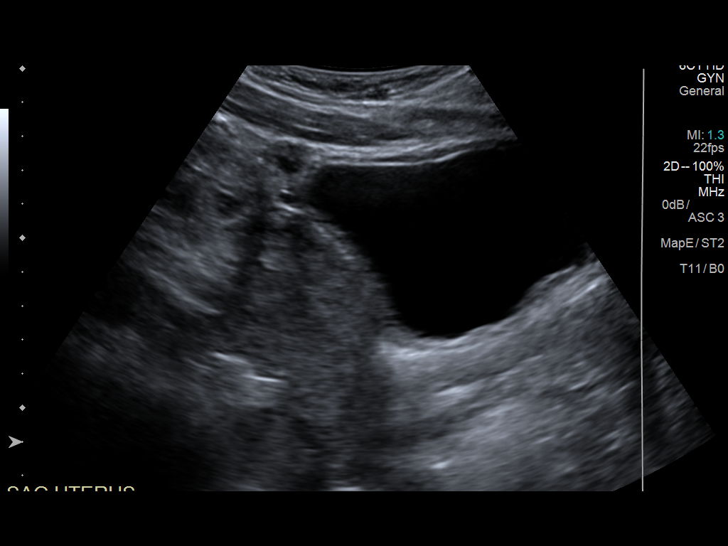
[im 22/66]
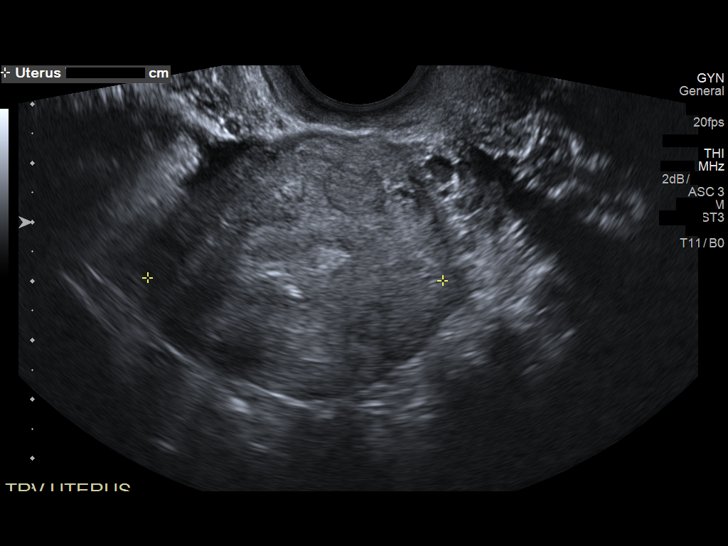
[im 28/66]
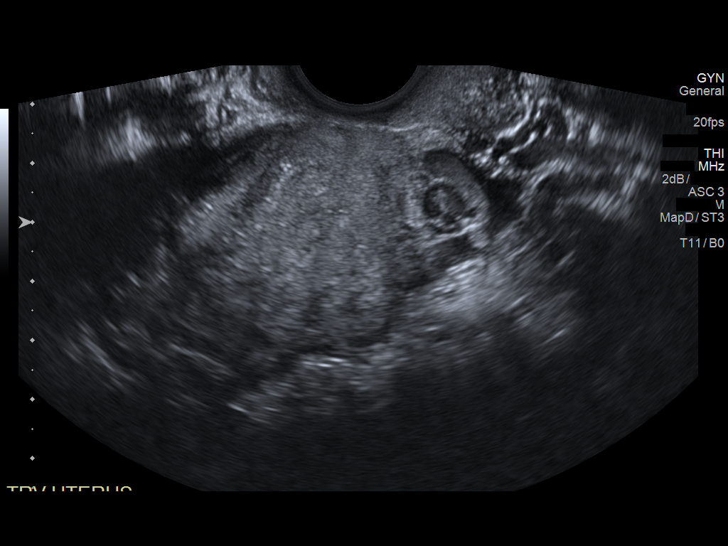
[im 33/66]
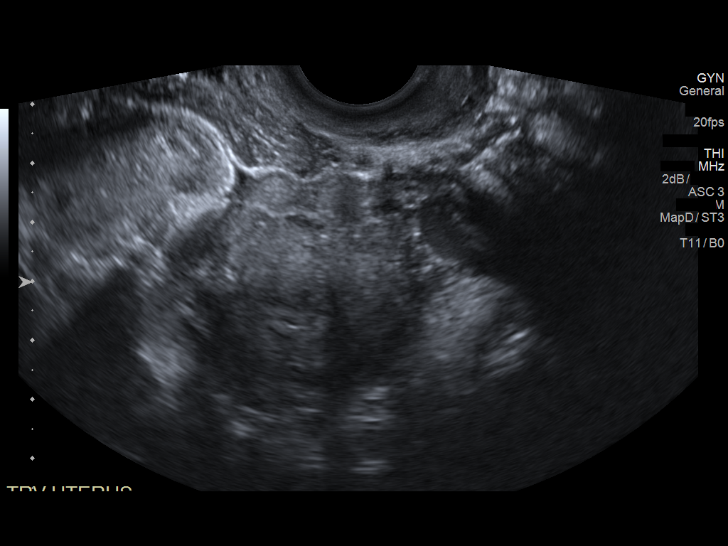
[im 38/66]
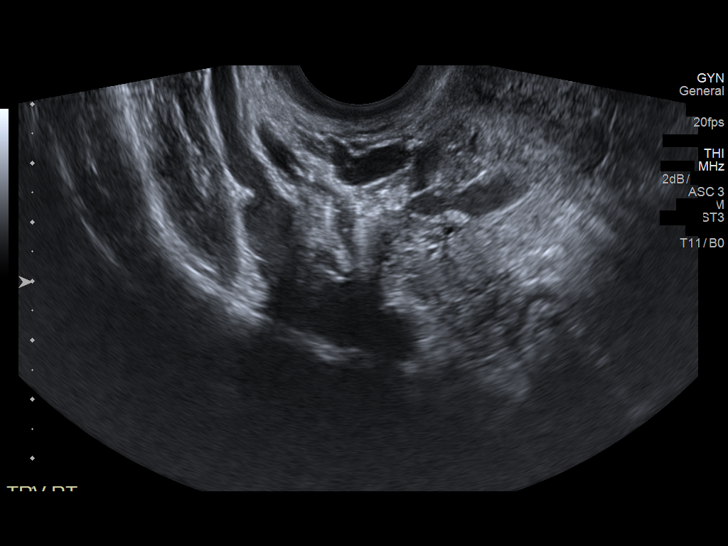
[im 44/66]
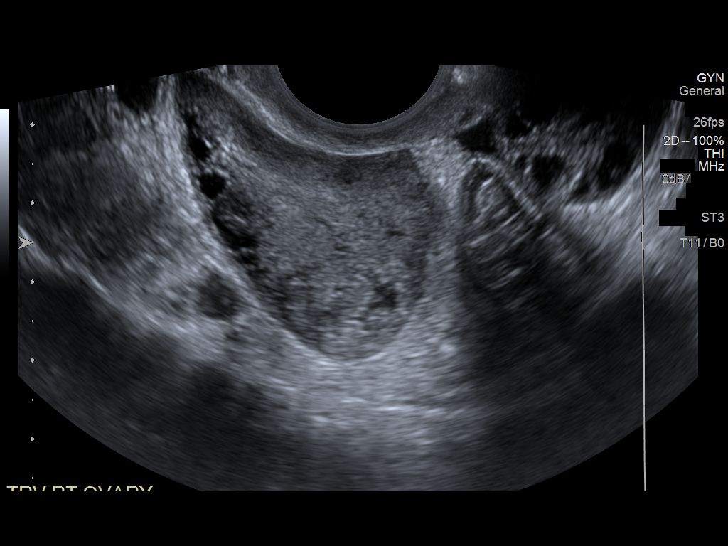
[im 49/66]
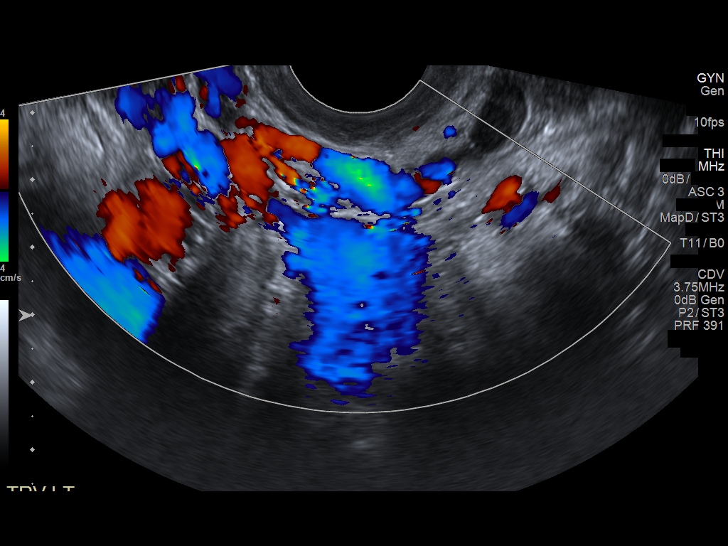
[im 55/66]
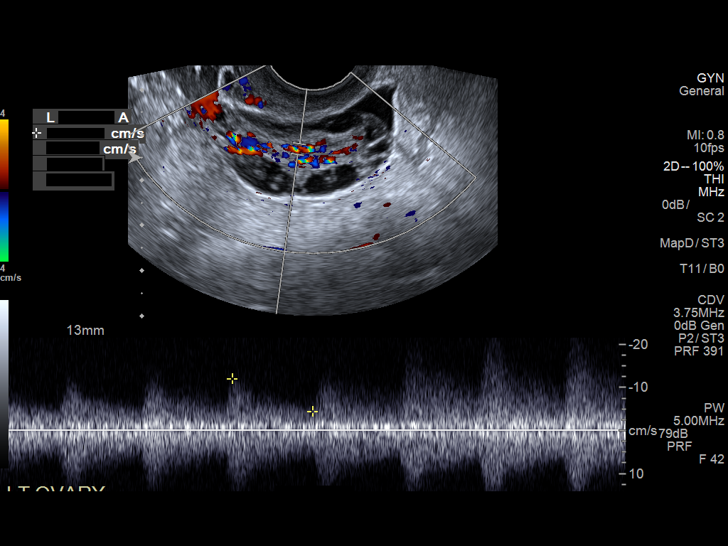
[im 60/66]
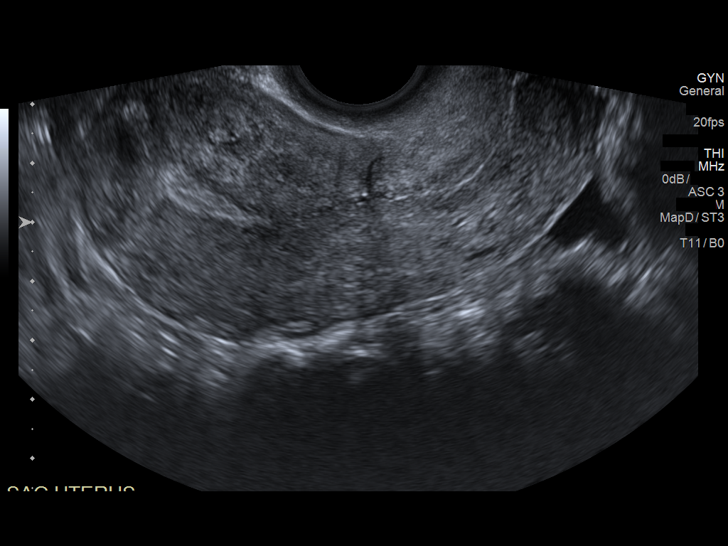
[im 66/66]
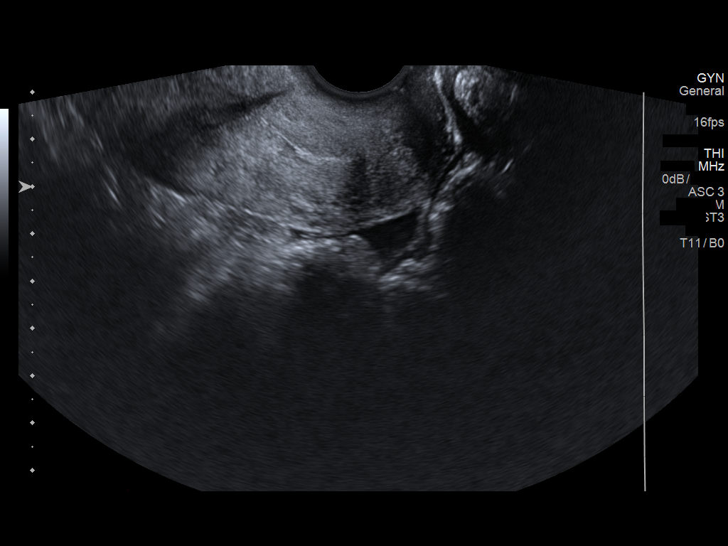

[13 of 25 positions shown; findings below may reference images not displayed]

FINDINGS: Uterus

Measurements: 8.9 x 4.4 x 5.0 cm. 5 mm avascular isoechoic round
structure at the level of the lower uterine segment/cervix within
endometrium with enhanced through transmission.

Endometrium

Thickness: 7.2 mm.  No focal abnormality visualized.

Right ovary

Measurements: 3.9 x 2.8 x 2.8 cm. Normal appearance/no adnexal mass.
1.6 cm corpus luteum cyst.

Left ovary

Measurements: 3.7 x 1.6 x 3.0 cm. Normal appearance/no adnexal mass.

Pulsed Doppler evaluation of both ovaries demonstrates normal
low-resistance arterial and venous waveforms.

Other findings

Trace simple free fluid in the pelvis, likely physiologic.
IMPRESSION: 1. 5 mm complex cyst at level of lower uterine segment/cervix,
likely nabothian cyst containing debris.
2. No acute process identified. No evidence for hydrosalpinx or
pelvic inflammatory disease. No findings of torsion at this time.

By: Kazunari Abdulloev M.D.

## 2020-05-14 IMAGING — CT CT ABD-PELV W/ CM
2 of 4 series · 16 of 46 positions shown, 18 images · IV contrast (Omni 300)
Comparison: None.

CLINICAL DATA: Right mid and low abdominal pain with nausea,
vomiting, and diarrhea for 2 days.

EXAM:
CT ABDOMEN AND PELVIS WITH CONTRAST
TECHNIQUE: Multidetector CT imaging of the abdomen and pelvis was performed
using the standard protocol following bolus administration of
intravenous contrast.
CONTRAST:  100mL OMNIPAQUE IOHEXOL 300 MG/ML  SOLN

[Series 3: a/p w/ 5mm · axial · 0.82mm/px · z∈[+853,+1293]mm · 13 of 96 slices shown, 15 images]
[im 4/96  soft-tissue]
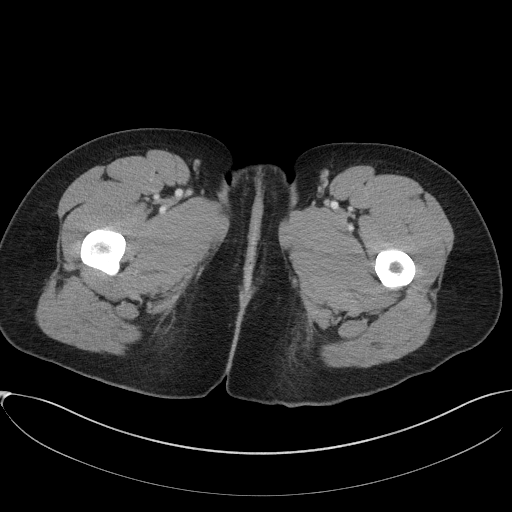
[im 4/96  bone]
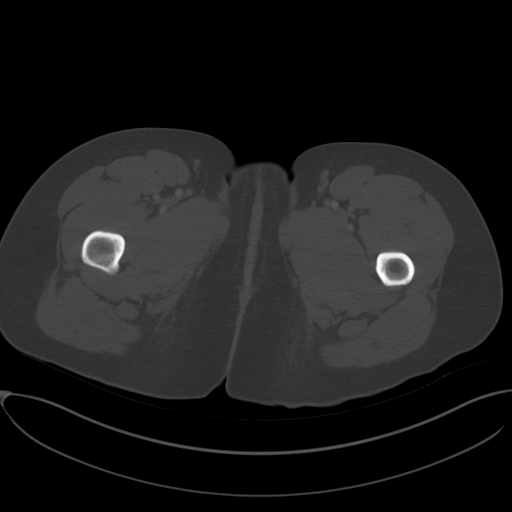
[im 11/96  soft-tissue]
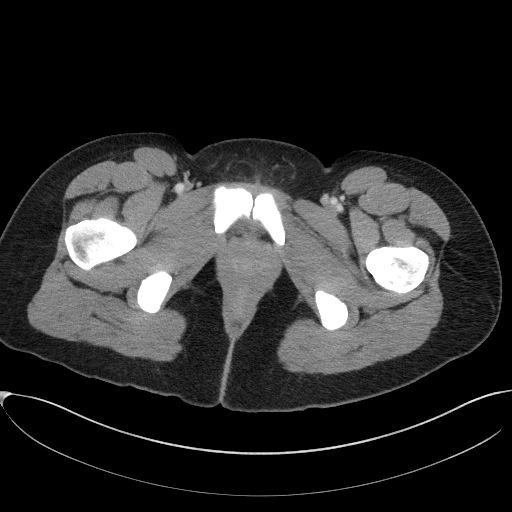
[im 19/96  soft-tissue]
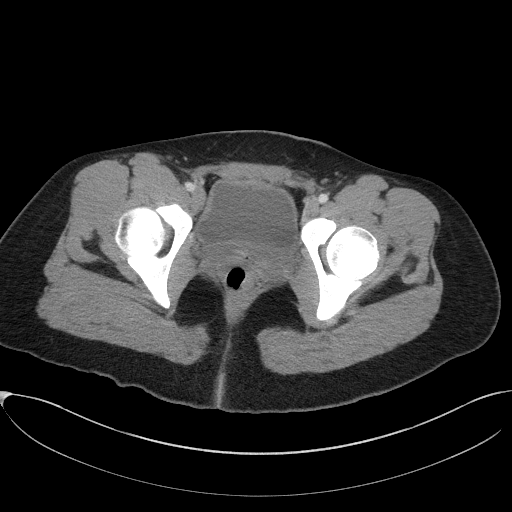
[im 26/96  soft-tissue]
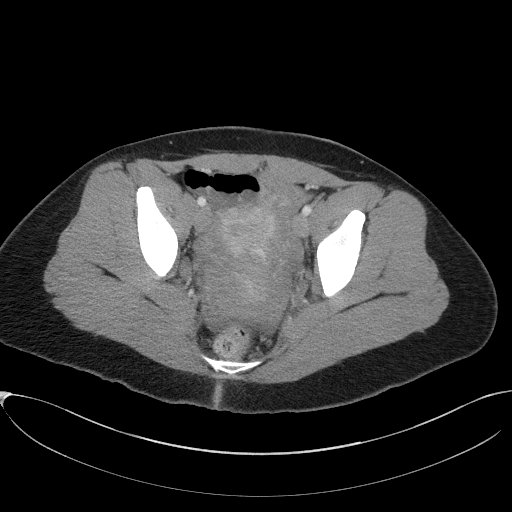
[im 33/96  soft-tissue]
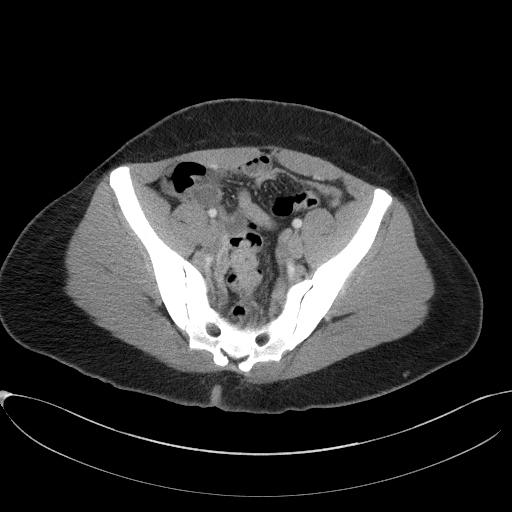
[im 41/96  soft-tissue]
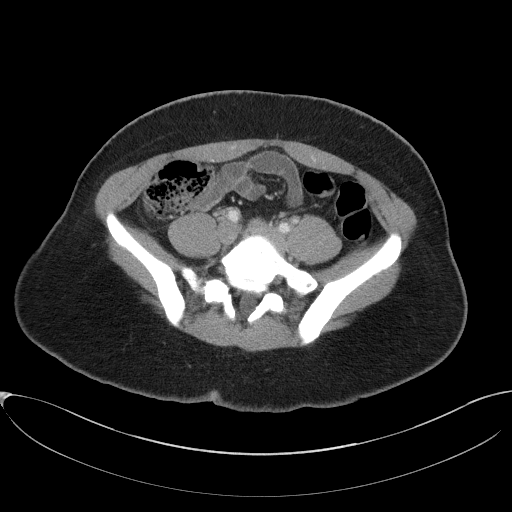
[im 48/96  soft-tissue]
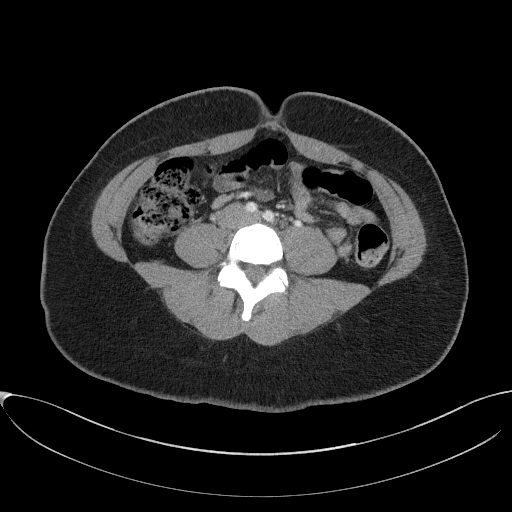
[im 55/96  soft-tissue]
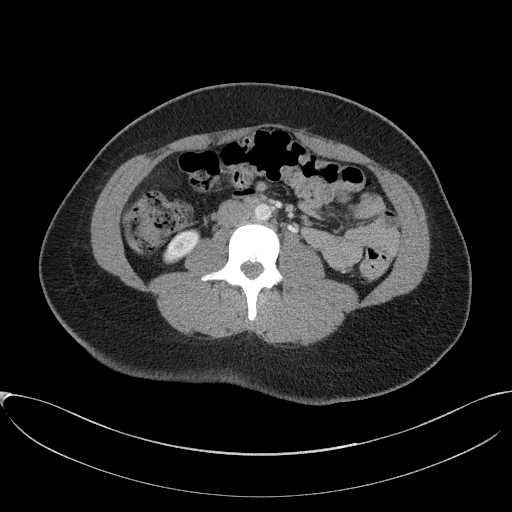
[im 63/96  soft-tissue]
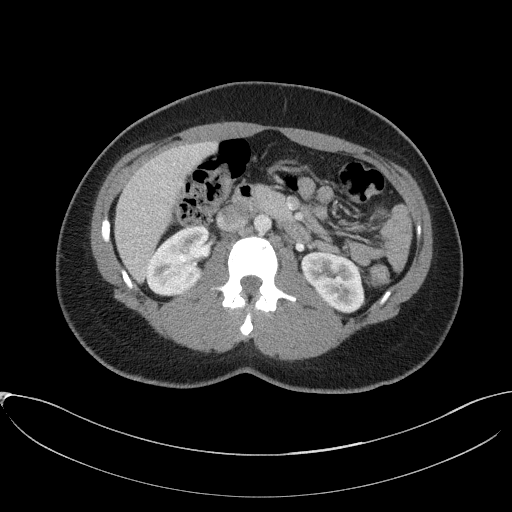
[im 63/96  bone]
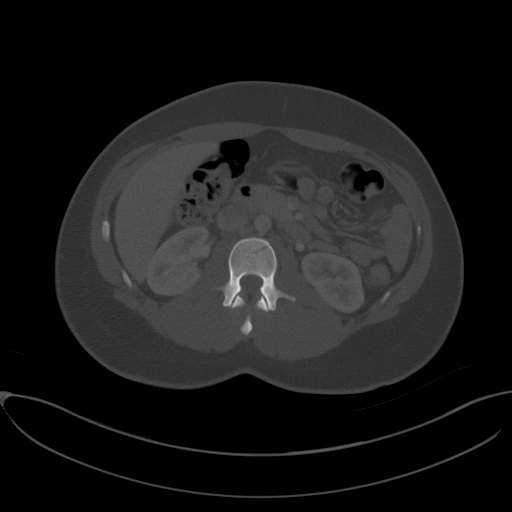
[im 70/96  soft-tissue]
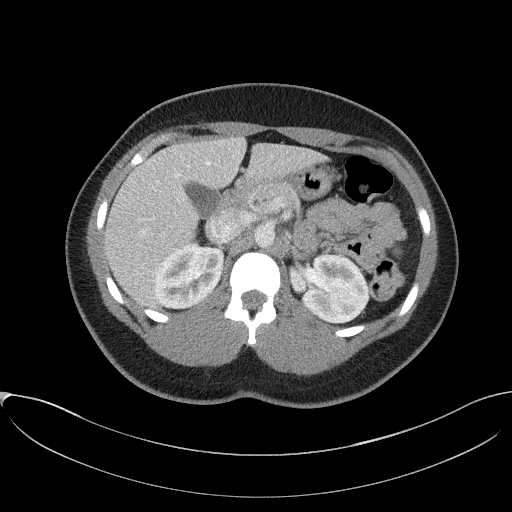
[im 77/96  soft-tissue]
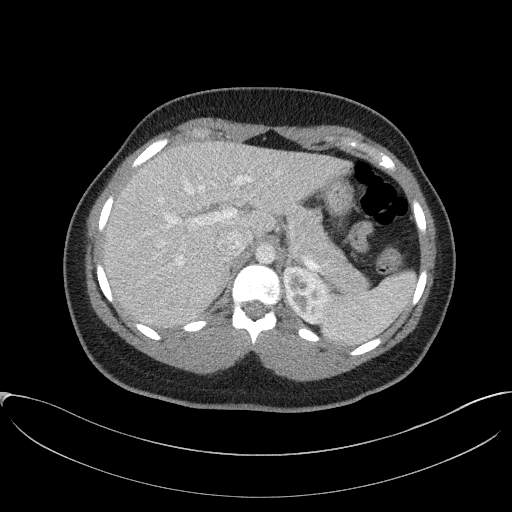
[im 85/96  soft-tissue]
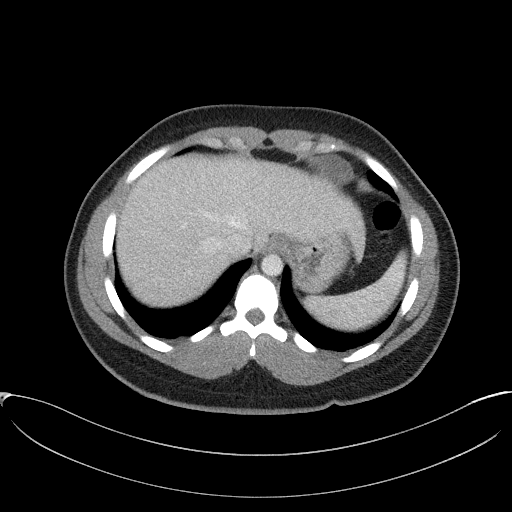
[im 92/96  soft-tissue]
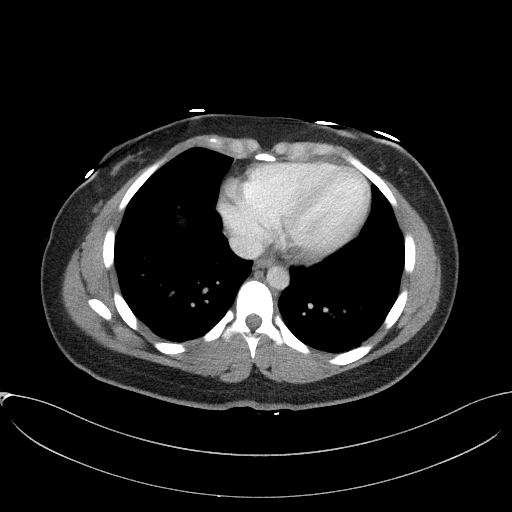

[Series 6: a/p w/ cor · coronal · 0.83mm/px · 3 of 125 slices shown]
[im 42/125  soft-tissue]
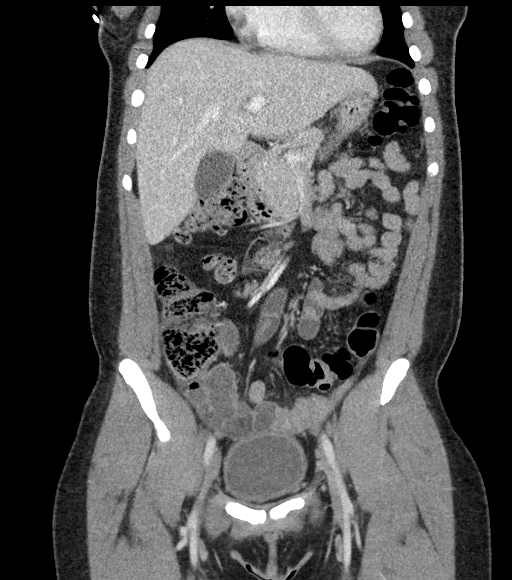
[im 56/125  soft-tissue]
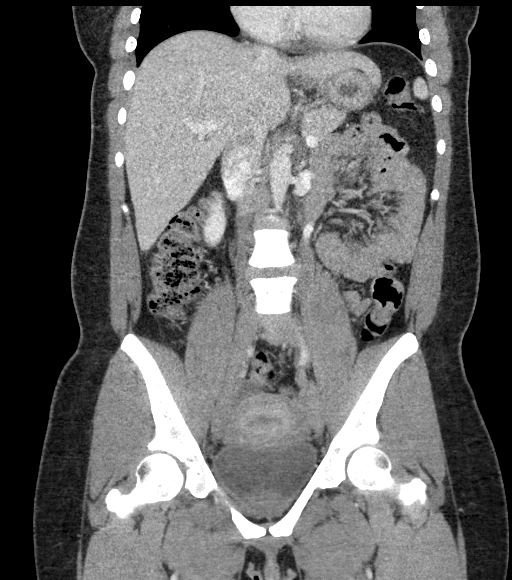
[im 69/125  soft-tissue]
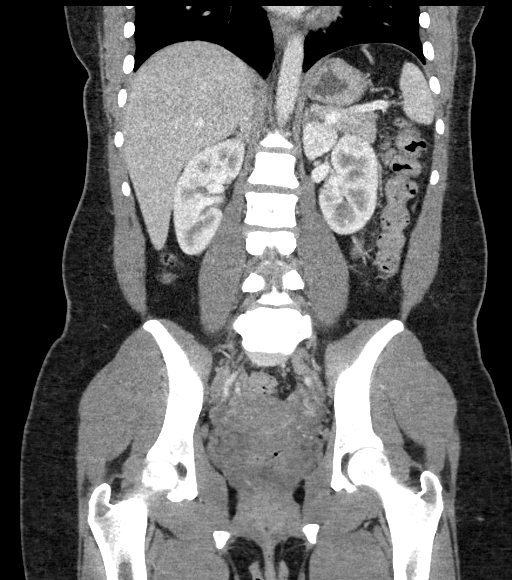

[16 of 46 positions shown; findings below may reference images not displayed]

FINDINGS: Lower chest: Dependent changes in the lung bases.

Hepatobiliary: No focal liver abnormality is seen. No gallstones,
gallbladder wall thickening, or biliary dilatation.

Pancreas: Unremarkable. No pancreatic ductal dilatation or
surrounding inflammatory changes.

Spleen: Normal in size without focal abnormality.

Adrenals/Urinary Tract: Adrenal glands are unremarkable. Kidneys are
normal, without renal calculi, focal lesion, or hydronephrosis.
Bladder is unremarkable.

Stomach/Bowel: Stomach, small bowel, and colon are not abnormally
distended. Scattered stool throughout the colon. The appendix is
normal.

Vascular/Lymphatic: No significant vascular findings are present. No
enlarged abdominal or pelvic lymph nodes.

Reproductive: Uterus is anteverted and appears normal. Mild
prominence of the ovaries bilaterally with no specific focal
nodules. Probable involuting cyst on the right ovary. There is
fullness in the adnexal regions possibly representing dilated
tubules. Changes could reflect hydrosalpinx or pelvic inflammatory
disease. Suggest ultrasound for further evaluation. Small amount of
free fluid in the pelvis is likely physiologic or inflammatory.

Other: No free air in the abdomen. Abdominal wall musculature
appears intact.

Musculoskeletal: No acute or significant osseous findings.
IMPRESSION: 1. Nonspecific fullness in the adnexal regions possibly representing
hydrosalpinx or pelvic inflammatory disease. Suggest ultrasound for
further evaluation.
2. No evidence of bowel obstruction or inflammation. Appendix is
normal.
